# Patient Record
Sex: Male | Born: 1965 | Race: White | Hispanic: No | Smoking: Current every day smoker
Health system: Southern US, Community
[De-identification: ages and names within clinical notes are randomized; demographics above are authoritative.]

## PROBLEM LIST (undated history)

## (undated) DIAGNOSIS — F1910 Other psychoactive substance abuse, uncomplicated: Secondary | ICD-10-CM

---

## 2007-02-14 ENCOUNTER — Emergency Department (HOSPITAL_COMMUNITY): Admission: EM | Admit: 2007-02-14 | Discharge: 2007-02-14 | Payer: Self-pay | Admitting: Emergency Medicine

## 2007-02-14 ENCOUNTER — Inpatient Hospital Stay (HOSPITAL_COMMUNITY): Admission: AD | Admit: 2007-02-14 | Discharge: 2007-02-21 | Payer: Self-pay | Admitting: Psychiatry

## 2007-02-14 ENCOUNTER — Ambulatory Visit: Payer: Self-pay | Admitting: Psychiatry

## 2007-07-05 ENCOUNTER — Emergency Department (HOSPITAL_COMMUNITY): Admission: EM | Admit: 2007-07-05 | Discharge: 2007-07-05 | Payer: Self-pay | Admitting: Emergency Medicine

## 2007-09-27 ENCOUNTER — Ambulatory Visit: Payer: Self-pay | Admitting: Psychiatry

## 2007-09-27 ENCOUNTER — Emergency Department (HOSPITAL_COMMUNITY): Admission: EM | Admit: 2007-09-27 | Discharge: 2007-09-27 | Payer: Self-pay | Admitting: Emergency Medicine

## 2007-09-27 ENCOUNTER — Inpatient Hospital Stay (HOSPITAL_COMMUNITY): Admission: RE | Admit: 2007-09-27 | Discharge: 2007-10-02 | Payer: Self-pay | Admitting: Psychiatry

## 2009-04-14 IMAGING — CR DG LUMBAR SPINE COMPLETE 4+V
5 series · 5 of 5 positions shown · non-contrast
Comparison: None.

CLINICAL DATA: Fell 2 days ago, persistent low back pain.

LUMBAR SPINE - 5 VIEW 07/05/2007:

[t l-spine a.p.]
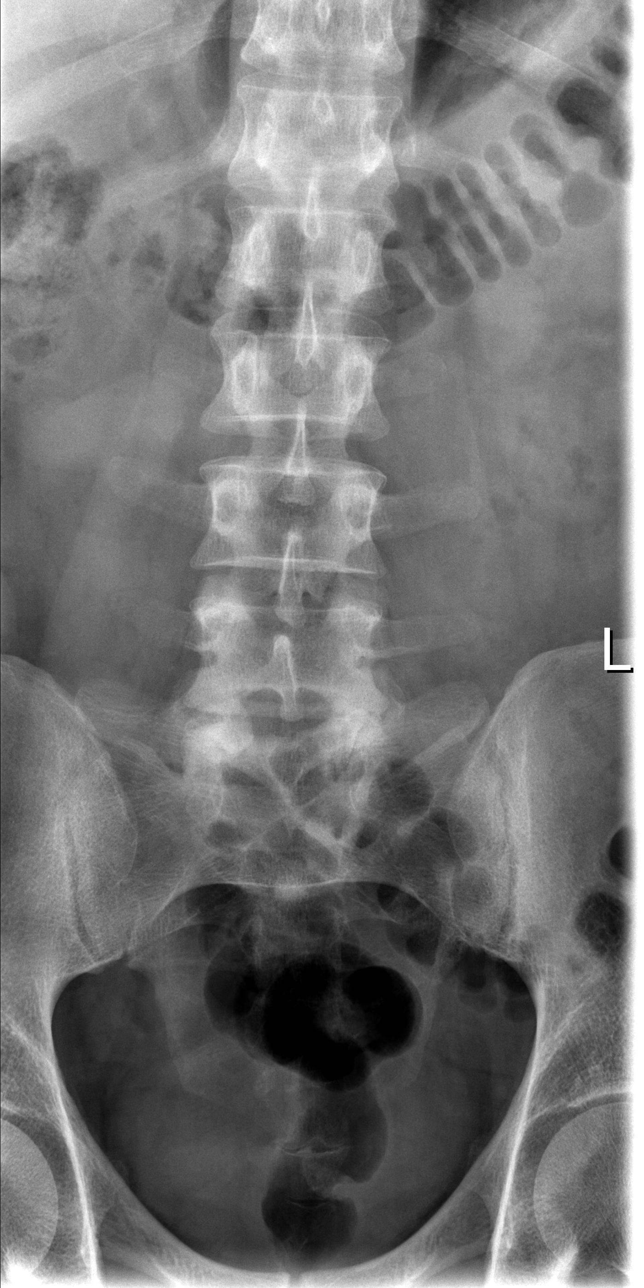

[t l-spine oblique exposure (1 of 2)]
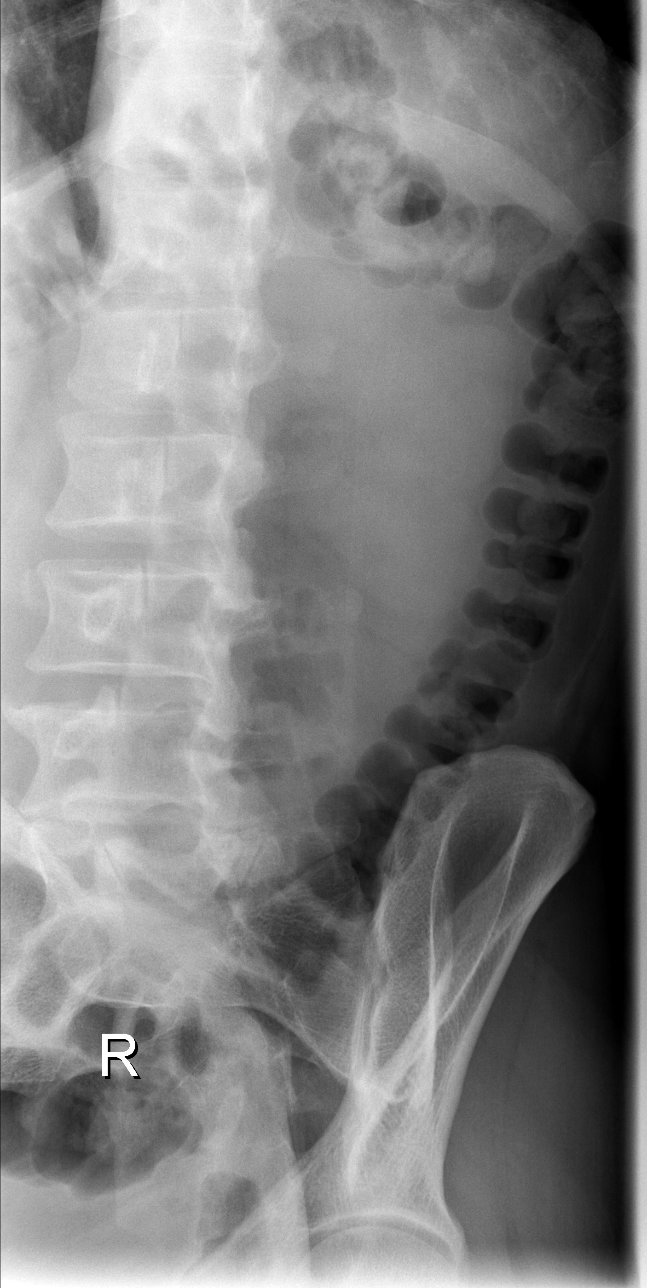

[t l-spine oblique exposure (2 of 2)]
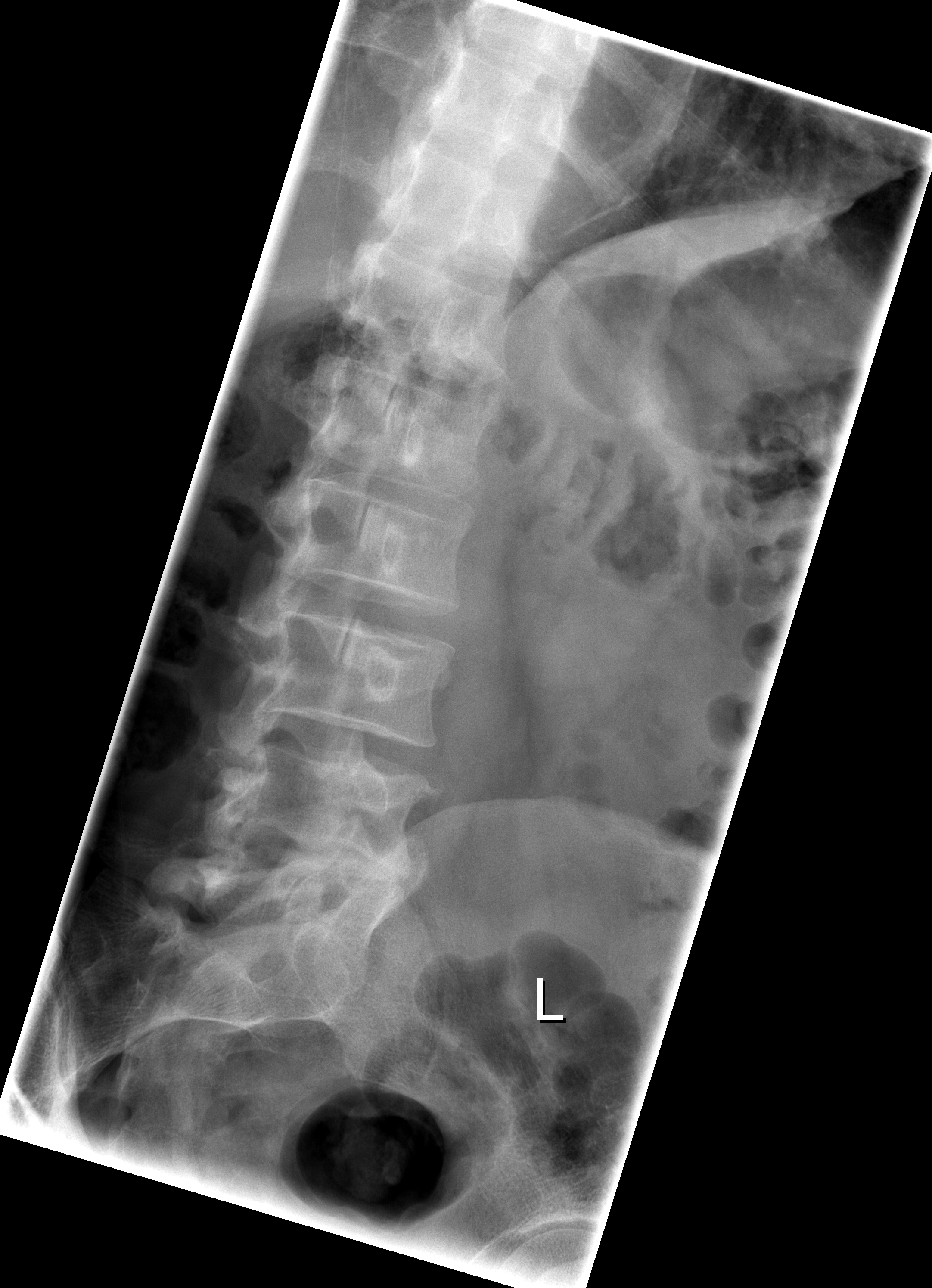

[t l-spine lat]
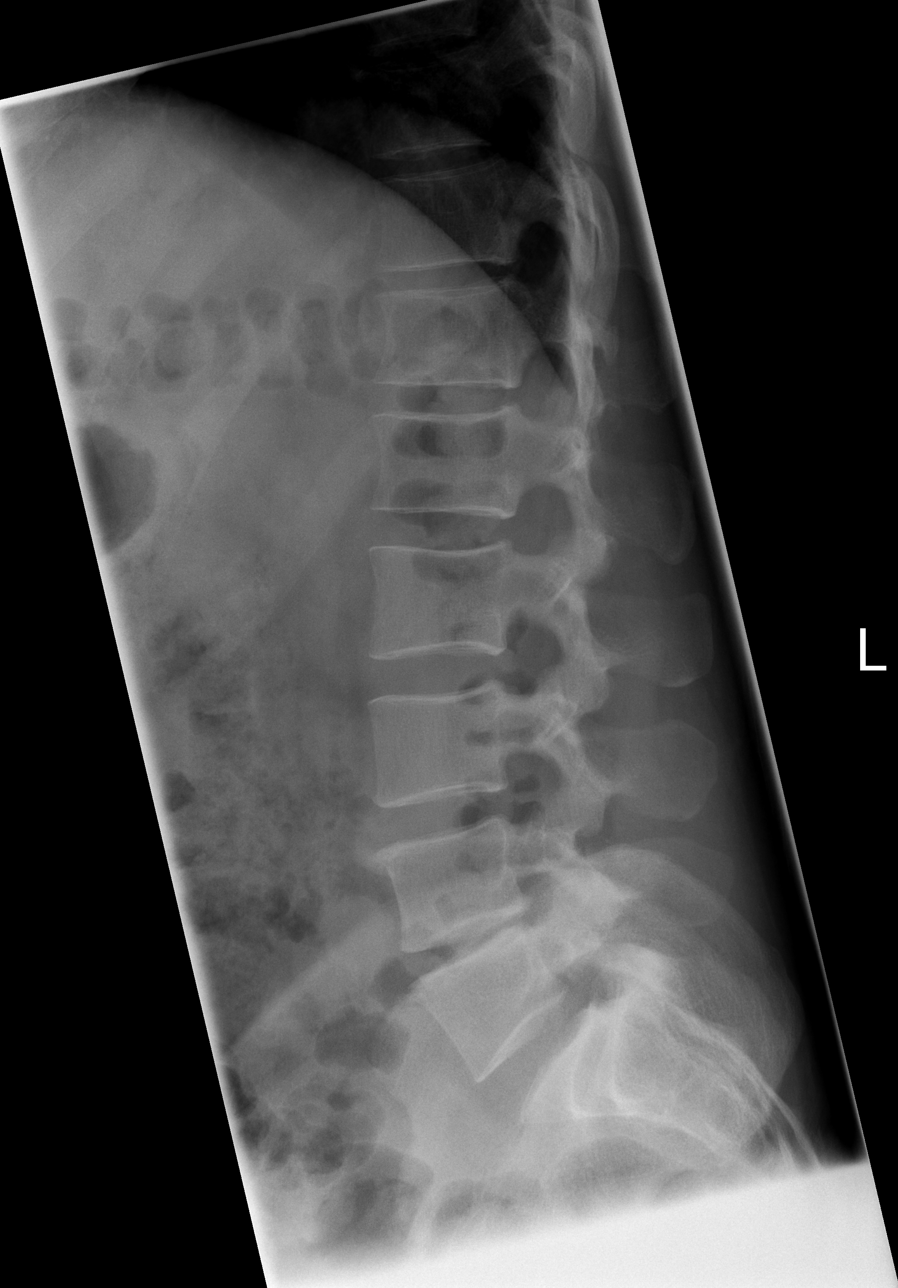

[t l-spine l5-s1 spot]
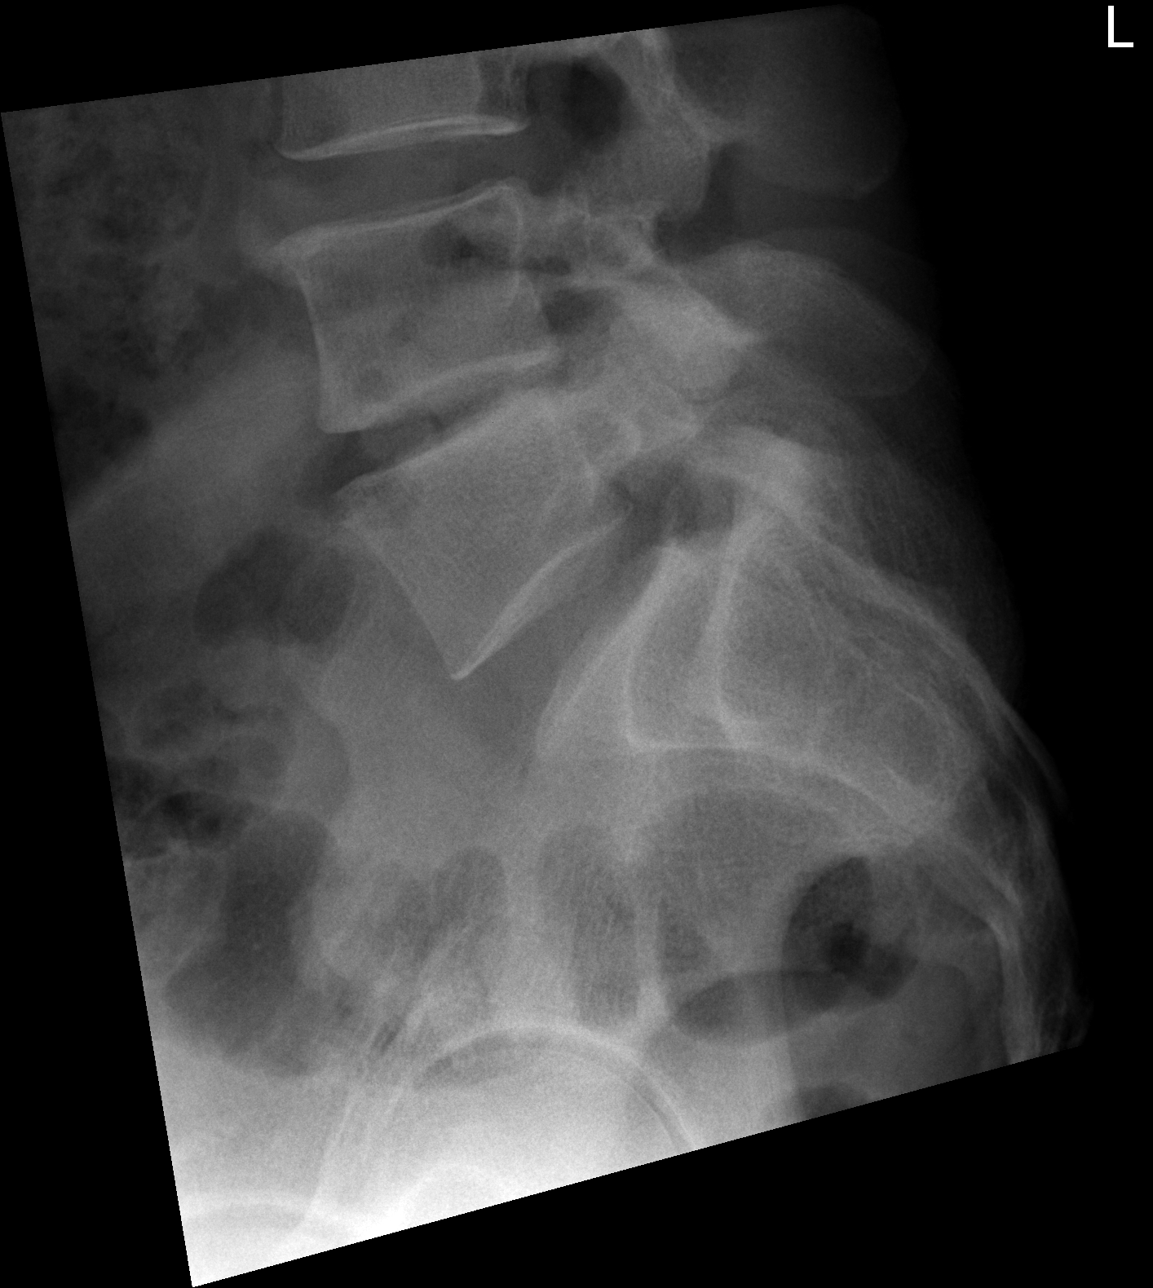

[5 of 5 positions shown; findings below may reference images not displayed]

FINDINGS: Five nonrib-bearing lumbar vertebrae with anatomic alignment. No
visible fractures. Disc space narrowing and associated endplate hypertrophic
changes at L4-L5. Minimal disc space narrowing at L3-L4. No pars defects. No
significant facet arthropathy. Visualized sacroiliac joints intact. Bone island
noted in the midline of the second sacral segment.
IMPRESSION: No acute skeletal abnormalities. Degenerative disc disease and spondylosis at
L4-L5, and minimal disc space narrowing at L3-L4.

## 2010-11-29 NOTE — H&P (Signed)
NAMEVENSON, Timothy Owens             ACCOUNT NO.:  [id number]   MEDICAL RECORD NO.:  [id number]          PATIENT TYPE:  IPS   LOCATION:  0503                          FACILITY:  [facility]   PHYSICIAN:  Geoffery Lyons, M.D.      DATE OF BIRTH:  January 29, 1966   DATE OF ADMISSION:  09/27/2007  DATE OF DISCHARGE:                       PSYCHIATRIC ADMISSION ASSESSMENT   This is a voluntary admission to the services of  Dr. Geoffery Lyons.   IDENTIFYING INFORMATION:  This is a 45 year old, separated, white male.  He reported to the emergency department at [facility].  He reported that  he had tried to jump off a bridge earlier in the day, that he was  suicidal, that he has to get off of crack cocaine, he has been using it  for 23 years, he acknowledged his last use of alcohol and crack at 8:30  in the morning, and he had his luggage with him.  He is hoping to be  reassigned to the [facility].  He states that when he  was there when he was admitted last time, he stayed clean for about four  months.  He is hoping to enter a year-long program, get clean, and stay  clean.   SOCIAL HISTORY:  He has a GED, he has been married once, he is  separated, he has a 11 year old daughter, he is employed as a Ecologist, he got in last Tuesday, and he is not sure that he is still  employed.   PAST PSYCHIATRIC HISTORY:  He was last with [place] July 31st to August 7th.  [facility] he was [facility] for four [facility], and he  stayed clean.   FAMILY HISTORY:  He states years ago he thinks one of his sisters had a  problem with alcohol and pills.   ALCOHOL AND DRUG HISTORY:  He himself reports that he has used crack  cocaine for about 23 years, he uses a half an ounce daily, and he has  been using alcohol since his teens, a pint to a fifth every three to  four weeks for years.  He had no alcohol in his system at the emergency  department, but his UDS was positive for cocaine.   PRIMARY  CARE Deen Deguia:  He has no PCC, he has no psychiatrist.   MEDICAL PROBLEMS:  He has no diagnoses, he is not prescribed any  medication, he reports a DRUG ALLERGY TO PENICILLIN.   POSITIVE PHYSICAL FINDINGS:  He was medically cleared in the ED at [facility].  His potassium was slightly low at 3.3, his UDS was positive for  cocaine, he had no alcohol on board.  Vital signs on admission to our  unit show that he is 69.5 inches tall, and he weighs 177.  Temperature  is 97.8, blood pressure is 96/64 to 107/65, pulse is 85 to 86, and  respirations are 18.   MENTAL STATUS EXAM:  He was examined in his room in the bed.  He is  casually dressed.  He is appropriately groomed and nourished.  His  speech  had a normal rate, rhythm, and tone.  His mood is appropriate to  the situation.  His affect had a normal range.  Thought processes are  clear, rational, and goal oriented.  He is hoping to get into long-term  treatment [facility] [place].  Judgment and insight  are intact.  Concentration and memory are intact.  Intelligence is at  least average.  He denies being actively suicidal or homicidal.  He  denies any auditory or visual hallucinations.   DIAGNOSES:   AXIS I:  1. Polysubstance dependence.  2. Depressive disorder not otherwise specified.   AXIS II:  Deferred.   AXIS III:  None.   AXIS IV:  Primary support, and he may be unemployed at this time.   AXIS V:  Thirty-five.   The plan is to admit for safety and stabilization.  He will be started  on antidepressant.  Toward that end, we will start Celexa 20 mg p.o. at  h.s.  He has already made contact with the [facility]  and he was told that if he was medically cleared here it would move him  up on the list, and he is hoping our case manager will speak with that  case manager on Monday and get that discharge plan set.   ESTIMATED LENGTH OF STAY:  Three to four days.      [name],  P.A.-C.      Geoffery Lyons, M.D.  Electronically Signed    MD/MEDQ  D:  09/28/2007  T:  09/28/2007  Job:  [id number]

## 2010-11-29 NOTE — Discharge Summary (Signed)
NAMEAADITYA, LETIZIA             ACCOUNT NO.:  [id number]   MEDICAL RECORD NO.:  [id number]          PATIENT TYPE:  IPS   LOCATION:  0603                          FACILITY:  [facility]   PHYSICIAN:  Jasmine Pang, M.D. DATE OF BIRTH:  04-15-1966   DATE OF ADMISSION:  02/14/2007  DATE OF DISCHARGE:  02/21/2007                               DISCHARGE SUMMARY   IDENTIFICATION:  This is a 45 year old white male who was admitted on a  voluntary basis on February 14, 2007.   HISTORY OF PRESENT ILLNESS:  The patient stated he had no suicidal with  plan and intent.  He has a history of substance abuse and cannot  contract for safety.  A stressor revolves around a relationship he has  with his girlfriend.  He states he knows he has got to give this  relationship up if he is going to recover because she is also using.  He  admits to using alcohol, a fifth of liquor two times weekly and uses an  eight-ball of cocaine daily.  He has been at [facility] in the  past (February of 2008 and June of 2008) for suicidal ideation and  detox.  He stated he had thoughts of jumping off a bridge.  He denied  hallucinations.  This is his first [facility] admission and he has a positive  history of cutting himself.   MEDICAL HISTORY:  He has no acute or chronic medical problems.   MEDICATIONS:  He is on no medications.   ALLERGIES:  PENICILLIN.   PHYSICAL EXAMINATION:  Physical exam was done in the [facility] ED.  There were no acute physical or medical abnormalities noted.   LABORATORY DATA:  EKG was normal.  Urinalysis was negative.  UDS was  positive for cocaine.  Alcohol was less than 5.  BMET was within normal  limits.  Creatinine was 1.3 and the rest of the labs were done in the ED  and evaluated by the ED physician.   HOSPITAL COURSE:  Upon admission, the patient was started on the Librium  detox protocol and Ambien 10 mg p.o. q.h.s. p.r.n. insomnia and  Symmetrel 100 mg p.o. b.i.d.  On February 17, 2007, he was started on  Seroquel 50 mg p.o. q.4h. p.r.n. anxiety and agitation.  The patient  tolerated his medications well with no significant side effects.  He had  no withdrawal symptoms other than craving of cocaine.  He was able to  participate appropriately in unit therapeutic groups and activities.  He  was friendly and cooperative with good eye contact.  He stated his main  drug of choice was cocaine mixed with alcohol.  He admits to suicidal  ideation because of desperation about his drug use.  He states he has to  let go of his girlfriend because she uses and this is very tough for him  because he loves her.  He talked about dreaming about using crack  cocaine which made him very anxious.  He continued to crave but did feel  the amantadine helped lessen this.  His mood was initially  dysphoric and  anxious.  He was not sure where he was going to go when he leaves.  On  February 19, 2007, his mood improved.  He had gotten into [facility] for rehab.  He felt good about that.  He stated he had  gotten some bad news.  He said someone he knew had been found dead  while they were using crack cocaine.  He was sad about this but overall  felt more optimistic due to his plans for rehab.  On February 21, 2007,  mental status had improved markedly from admission status.  The patient  was friendly and cooperative with good eye contact.  Speech was normal  rate and flow.  Psychomotor activity was within normal limits.  The mood  was euthymic.  Affect wide range.  There was no suicidal or homicidal  ideation.  No thoughts of self-injurious behavior.  No auditory or  visual hallucinations.  No paranoia or delusions.  Thoughts were logical  and goal-directed.  Thought content no predominant theme and the  cognitive was grossly within normal limits.  It was felt the patient was  safe to be discharged today and is looking forward to being picked up by  his sponsor and going to a  meeting and then going to [facility].   DISCHARGE DIAGNOSES:  AXIS I:  Polysubstance dependence.  Substance-  induced mood disorder.  AXIS II:  None.  AXIS III:  No acute or chronic problems.  AXIS IV:  Severe (problems with primary support group, having to break  up with girlfriend, occupational problem, housing problem, burden of  psychiatric and chemical dependence illness).  AXIS V:  GAF on discharge 50; GAF upon admission 40; GAF highest past  year 60-65.   ACTIVITY/DIET:  There were no specific activity level or dietary  restrictions.   DISCHARGE MEDICATIONS:  The patient was discharged on amantadine 100 mg  p.o. b.i.d.  He was on no other medications.   POST-HOSPITAL CARE PLANS:  The patient will be seen by a psychiatrist  and therapist in [place].  This is being arranged by our  casemanager.      Jasmine Pang, M.D.  Electronically Signed     BHS/MEDQ  D:  02/21/2007  T:  02/21/2007  Job:  [id number]

## 2010-12-02 NOTE — Discharge Summary (Signed)
Timothy Owens, Timothy Owens             ACCOUNT NO.:  [id number]   MEDICAL RECORD NO.:  [id number]          PATIENT TYPE:  IPS   LOCATION:  0503                          FACILITY:  [facility]   PHYSICIAN:  Geoffery Lyons, M.D.      DATE OF BIRTH:  27-Dec-1965   DATE OF ADMISSION:  09/27/2007  DATE OF DISCHARGE:  10/02/2007                               DISCHARGE SUMMARY   CHIEF COMPLAINT AND HISTORY OF PRESENT ILLNESS:  This was the first  admission to [facility] for this 45 year old separated  white male.  He had tried to jump off a bridge earlier .  He had to get  off crack cocaine, had been using it for 23 years.  Last use of alcohol  at 8:30 in the morning. Hoping to be going to the [employer]. When he was there, he claimed he stayed clean for about 4  months.  Hoping to enter a year-long program.   PAST PSYCHIATRIC HISTORY:  He was last admitted July 13 to August 7 at  [facility].   ALCOHOL AND DRUG HISTORY:  Crack cocaine for 23 years, alcohol since his  teens, a pint to a fifth every 3-4 week.   PAST MEDICAL HISTORY:  Noncontributory.   MEDICATIONS:  None.   PHYSICAL EXAMINATION:  Exam failed to show any acute findings.   LABORATORY WORK:  Potassium 3.3.  Urine drug screen positive for  cocaine.   MENTAL STATUS EXAM:  Exam reveals alert, cooperative male, appropriately  groomed and nourished.  Speech was normal in rate, rhythm and tone.  Mood was anxious, depressed.  Affect was anxious.  Thought processes  were clear, rational and goal oriented.  Wanting to get into a [facility] in [place].  No active delusions.  No active suicidal or  homicidal ideas. No hallucinations.  Cognition well preserved.   ADMISSION DIAGNOSES:  AXIS I:  1. Cocaine dependence.  2. Depressive disorder not otherwise specified.  AXIS II:  No diagnosis.  AXIS III:  No diagnosis.  AXIS IV:  Moderate.  AXIS V:  On admission 35, highest GAF in the last  year 60.   COURSE IN THE HOSPITAL:  Was admitted, started individual and group  psychotherapy.  He was given trazodone for sleep.  He was detoxified  with Librium.  He was given some Seroquel. 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  Drank liquor, started using cocaine. Developed  chest pain.  He was staying with his boss. After he left the [facility], he said that he came around the wrong group of people. The day  before he was admitted endorsed suicidal ideations, wanting to jump from  a bridge. March 15, he endorsed that while he was undergoing all these  things that he might have acted out sexually. Wanted to be checked for  STDs, HIV. He said that the Seroquel helped him. Did endorse anxiety,  decreased sleep, so we optimized treatment with the Seroquel. Continued  to endorse that he wanted to do the right thing, wanted to recover  his  abstinence. March 17, he was fully detoxified. There were no active  suicidal or homicidal ideas, no hallucinations or delusions.  He was  going to follow up at [facility] and was looking  forward to it.   DISCHARGE DIAGNOSES:  AXIS I:  1. Cocaine, alcohol dependence.  2. Depressive disorder not otherwise specified.  3. Anxiety disorder not otherwise specified.  AXIS II: No diagnosis.  AXIS III:  No diagnosis.  AXIS IV:  Moderate.  AXIS V:  Upon discharge 55-60.   DISCHARGE MEDICATIONS:  1. Celexa 20 mg per day.  2. Seroquel 50 one twice a day and 100 at bedtime.  3. Trazodone 100 at bedtime as needed for sleep.   Follow up at [facility].      Geoffery Lyons, M.D.  Electronically Signed     IL/MEDQ  D:  10/29/2007  T:  10/29/2007  Job:  [id number]

## 2011-01-02 ENCOUNTER — Emergency Department (HOSPITAL_COMMUNITY)
Admission: EM | Admit: 2011-01-02 | Discharge: 2011-01-03 | Disposition: A | Discharge status: Home / Self Care | Payer: Self-pay | Attending: Emergency Medicine | Admitting: Emergency Medicine

## 2011-01-02 DIAGNOSIS — F329 Major depressive disorder, single episode, unspecified: Secondary | ICD-10-CM | POA: Insufficient documentation

## 2011-01-02 DIAGNOSIS — R45851 Suicidal ideations: Secondary | ICD-10-CM | POA: Insufficient documentation

## 2011-01-02 DIAGNOSIS — F1910 Other psychoactive substance abuse, uncomplicated: Secondary | ICD-10-CM | POA: Insufficient documentation

## 2011-01-02 DIAGNOSIS — F3289 Other specified depressive episodes: Secondary | ICD-10-CM | POA: Insufficient documentation

## 2011-01-02 LAB — COMPREHENSIVE METABOLIC PANEL
AST: 19 U/L (ref 0–37)
Alkaline Phosphatase: 65 U/L (ref 39–117)
CO2: 24 mEq/L (ref 19–32)
Chloride: 104 mEq/L (ref 96–112)
Creatinine, Ser: 1.09 mg/dL (ref 0.50–1.35)
GFR calc Af Amer: >60 mL/min (ref >60)
GFR calc non Af Amer: >60 mL/min (ref >60)
Potassium: 4 mEq/L (ref 3.5–5.1)
Total Protein: 7.6 g/dL (ref 6.0–8.3)

## 2011-01-02 LAB — DIFFERENTIAL
Lymphocytes Relative: 31 % (ref 12–46)
Lymphs Abs: 2 10*3/uL (ref 0.7–4.0)
Monocytes Absolute: 0.4 10*3/uL (ref 0.1–1.0)
Monocytes Relative: 6 % (ref 3–12)
Neutro Abs: 3.6 10*3/uL (ref 1.7–7.7)
Neutrophils Relative %: 57 % (ref 43–77)

## 2011-01-02 LAB — RAPID URINE DRUG SCREEN, HOSP PERFORMED
Barbiturates: NOT DETECTED
Benzodiazepines: POSITIVE — AB
Cocaine: POSITIVE — AB
Opiates: NOT DETECTED
Tetrahydrocannabinol: POSITIVE — AB

## 2011-01-02 LAB — ETHANOL: Alcohol, Ethyl (B): <11 mg/dL (ref 0–11)

## 2011-01-02 LAB — CBC
Hemoglobin: 16.6 g/dL (ref 13.0–17.0)
MCHC: 36.2 g/dL — ABNORMAL HIGH (ref 30.0–36.0)
MCV: 83.5 fL (ref 78.0–100.0)
RDW: 12.6 % (ref 11.5–15.5)

## 2011-04-10 LAB — ETHANOL: Alcohol, Ethyl (B): <5

## 2011-04-10 LAB — CBC
HCT: 43.1
Hemoglobin: 14.8
MCHC: 34.4
MCV: 86.6
RBC: 4.98

## 2011-04-10 LAB — COMPREHENSIVE METABOLIC PANEL
Albumin: 4.6
Calcium: 9.8
Glucose, Bld: 92
Potassium: 3.3 — ABNORMAL LOW
Total Bilirubin: 1

## 2011-04-10 LAB — DIFFERENTIAL
Basophils Absolute: 0
Basophils Relative: 0
Monocytes Absolute: 0.6
Neutro Abs: 6

## 2011-04-10 LAB — RAPID URINE DRUG SCREEN, HOSP PERFORMED
Benzodiazepines: NOT DETECTED
Cocaine: POSITIVE — AB
Opiates: NOT DETECTED

## 2011-04-10 LAB — POCT CARDIAC MARKERS
CKMB, poc: 2.2
Operator id: 114141
Operator id: 288331
Troponin i, poc: <0.05
Troponin i, poc: <0.05

## 2011-04-10 LAB — URINALYSIS, ROUTINE W REFLEX MICROSCOPIC
Bilirubin Urine: NEGATIVE
Hgb urine dipstick: NEGATIVE
Ketones, ur: NEGATIVE
Nitrite: NEGATIVE
pH: 6

## 2011-04-10 LAB — GC/CHLAMYDIA PROBE AMP, URINE: Chlamydia, Swab/Urine, PCR: NEGATIVE

## 2011-04-10 LAB — HEPATITIS PANEL, ACUTE: Hep A IgM: NEGATIVE

## 2011-04-21 LAB — URINALYSIS, ROUTINE W REFLEX MICROSCOPIC
Nitrite: NEGATIVE
Specific Gravity, Urine: 1.022
pH: 7.5

## 2011-04-21 LAB — CBC
HCT: 45.1
Hemoglobin: 15.2
MCV: 86.9
WBC: 9.3

## 2011-04-21 LAB — HEPATIC FUNCTION PANEL
ALT: 19
AST: 23
Albumin: 4.6
Indirect Bilirubin: 0.6
Total Protein: 7.7

## 2011-04-21 LAB — DIFFERENTIAL
Basophils Relative: 1
Eosinophils Absolute: 0.2

## 2011-04-21 LAB — I-STAT 8, (EC8 V) (CONVERTED LAB)
BUN: 15
Chloride: 103
Glucose, Bld: 92
Potassium: 3.6
TCO2: 28
pH, Ven: 7.379 — ABNORMAL HIGH

## 2011-04-21 LAB — POCT I-STAT CREATININE
Creatinine, Ser: 1.2
Operator id: 161631

## 2011-04-21 LAB — RAPID URINE DRUG SCREEN, HOSP PERFORMED
Barbiturates: NOT DETECTED
Cocaine: POSITIVE — AB
Opiates: POSITIVE — AB

## 2011-05-01 LAB — URINALYSIS, ROUTINE W REFLEX MICROSCOPIC
Bilirubin Urine: NEGATIVE
Glucose, UA: NEGATIVE
Ketones, ur: NEGATIVE
Specific Gravity, Urine: 1.03
pH: 6

## 2011-05-01 LAB — RAPID URINE DRUG SCREEN, HOSP PERFORMED
Amphetamines: NOT DETECTED
Barbiturates: NOT DETECTED
Benzodiazepines: NOT DETECTED
Cocaine: POSITIVE — AB

## 2011-05-01 LAB — I-STAT 8, (EC8 V) (CONVERTED LAB)
Acid-Base Excess: 2
Chloride: 103
Glucose, Bld: 97
TCO2: 30
pCO2, Ven: 48.4
pH, Ven: 7.379 — ABNORMAL HIGH

## 2011-05-01 LAB — POCT CARDIAC MARKERS
CKMB, poc: <1 — ABNORMAL LOW
Myoglobin, poc: 77.6
Operator id: 279831

## 2011-05-01 LAB — POCT I-STAT CREATININE
Creatinine, Ser: 1.3
Operator id: 279831

## 2011-06-13 ENCOUNTER — Emergency Department (HOSPITAL_BASED_OUTPATIENT_CLINIC_OR_DEPARTMENT_OTHER)
Admission: EM | Admit: 2011-06-13 | Discharge: 2011-06-13 | Disposition: A | Discharge status: Home / Self Care | Payer: Self-pay | Attending: Emergency Medicine | Admitting: Emergency Medicine

## 2011-06-13 ENCOUNTER — Encounter: Payer: Self-pay | Admitting: *Deleted

## 2011-06-13 DIAGNOSIS — H6120 Impacted cerumen, unspecified ear: Secondary | ICD-10-CM | POA: Insufficient documentation

## 2011-06-13 DIAGNOSIS — J329 Chronic sinusitis, unspecified: Secondary | ICD-10-CM | POA: Insufficient documentation

## 2011-06-13 DIAGNOSIS — F17200 Nicotine dependence, unspecified, uncomplicated: Secondary | ICD-10-CM | POA: Insufficient documentation

## 2011-06-13 HISTORY — DX: Other psychoactive substance abuse, uncomplicated: F19.10

## 2011-06-13 MED ORDER — AZITHROMYCIN 250 MG PO TABS: 250.0000 mg | ORAL_TABLET | Freq: Every day | ORAL | Status: AC

## 2011-06-13 MED ORDER — AZITHROMYCIN 250 MG PO TABS
500.0000 mg | ORAL_TABLET | Freq: Once | ORAL | Status: AC
  Administered 2011-06-13: 500 mg via ORAL
  Filled 2011-06-13: qty 2

## 2011-06-13 NOTE — ED Provider Notes (Signed)
History     CSN: [id number] Arrival date & time: 06/13/2011  9:32 AM   First MD Initiated Contact with Patient 06/13/11 [phone]      Chief Complaint  Patient presents with  . Nasal Congestion  . Sinusitis    (Consider location/radiation/quality/duration/timing/severity/associated sxs/prior treatment) Patient is a 45 y.o. male presenting with sinusitis. The history is provided by the patient.  Sinusitis  This is a new problem. Episode onset: 2 weeks ago. The problem has not changed since onset.There has been no fever. The pain is at a severity of 2/10. The pain is mild. The pain has been constant since onset. Associated symptoms include congestion, ear pain, sinus pressure and cough. Pertinent negatives include no sweats, no sore throat and no shortness of breath. Associated symptoms comments: Difficulty hearing out of the left ear. He has tried nothing for the symptoms. The treatment provided no relief.    Past Medical History  Diagnosis Date  . Substance abuse     History reviewed. No pertinent past surgical history.  History reviewed. No pertinent family history.  History  Substance Use Topics  . Smoking status: Current Everyday Smoker  . Smokeless tobacco: Not on file  . Alcohol Use: No      Review of Systems  HENT: Positive for ear pain, congestion and sinus pressure. Negative for sore throat.   Eyes: Negative for pain and redness.  Respiratory: Positive for cough. Negative for shortness of breath.   All other systems reviewed and are negative.    Allergies  Penicillins  Home Medications   Current Outpatient Rx  Name Route Sig Dispense Refill  . AZITHROMYCIN 250 MG PO TABS Oral Take 1 tablet (250 mg total) by mouth daily. 4 tablet 0    BP 114/76  Pulse 89  Temp(Src) 97.9 F (36.6 C) (Oral)  Resp 18  SpO2 100%  Physical Exam  Nursing note and vitals reviewed. Constitutional: He is oriented to person, place, and time. He appears well-developed and  well-nourished. No distress.  HENT:  Head: Normocephalic and atraumatic.  Nose: Mucosal edema and rhinorrhea present. Right sinus exhibits maxillary sinus tenderness and frontal sinus tenderness. Left sinus exhibits maxillary sinus tenderness and frontal sinus tenderness.  Mouth/Throat: Oropharynx is clear and moist.       Cerumen impaction in bilateral ears. A pain over bilateral frontal and maxillary sinuses with palpation   Eyes: Conjunctivae and EOM are normal. Pupils are equal, round, and reactive to light.  Neck: Normal range of motion. Neck supple.  Cardiovascular: Normal rate, regular rhythm and intact distal pulses.   No murmur heard. Pulmonary/Chest: Effort normal and breath sounds normal. No respiratory distress. He has no wheezes. He has no rales.  Abdominal: Soft. He exhibits no distension. There is no tenderness. There is no rebound and no guarding.  Musculoskeletal: Normal range of motion. He exhibits no edema and no tenderness.  Neurological: He is alert and oriented to person, place, and time.  Skin: Skin is warm and dry. No rash noted. No erythema.  Psychiatric: He has a normal mood and affect. His behavior is normal.    ED Course  EAR CERUMEN REMOVAL Performed by: Gwyneth Sprout Authorized by: Gwyneth Sprout  EAR CERUMEN REMOVAL Date/Time: 06/13/2011 3:54 PM Performed by: Gwyneth Sprout Authorized by: Gwyneth Sprout Consent: Verbal consent not obtained. Local anesthetic: none Location details: left ear Procedure type: irrigation Patient sedated: no Patient tolerance: Patient tolerated the procedure well with no immediate complications. Comments: A large amount of  cerumen removed from the left ear canal with improvement in patient's hearing   (including critical care time)  Labs Reviewed - No data to display No results found.   1. Sinusitis   2. Excess ear wax       MDM   Pt with symptoms consistent with viral URI.  Well appearing here.   No signs of breathing difficulty  No signs of pharyngitis, otitis or abnormal abdominal findings.  However one week of tenderness over the frontal and maxillary sinus with significant tenderness here. Will treat for acute sinusitis with a Z-Pak. Also patient complaining of difficulty hearing out of his left ear. After repeat it flushes from the left ear removed a large amount of wax and patient's hearing improved.      Gwyneth Sprout, MD 06/13/11 1556

## 2011-06-13 NOTE — ED Notes (Signed)
Pt reports cough, congestion, and sinus pressure x 3 days, denies any other c/o.

## 2013-07-29 ENCOUNTER — Encounter (HOSPITAL_BASED_OUTPATIENT_CLINIC_OR_DEPARTMENT_OTHER): Payer: Self-pay | Admitting: Emergency Medicine

## 2013-07-29 ENCOUNTER — Emergency Department (HOSPITAL_BASED_OUTPATIENT_CLINIC_OR_DEPARTMENT_OTHER)
Admission: EM | Admit: 2013-07-29 | Discharge: 2013-07-29 | Disposition: A | Discharge status: Home / Self Care | Payer: Self-pay | Attending: Emergency Medicine | Admitting: Emergency Medicine

## 2013-07-29 DIAGNOSIS — Z880 Allergy status to penicillin: Secondary | ICD-10-CM | POA: Insufficient documentation

## 2013-07-29 DIAGNOSIS — K644 Residual hemorrhoidal skin tags: Secondary | ICD-10-CM | POA: Insufficient documentation

## 2013-07-29 DIAGNOSIS — F17200 Nicotine dependence, unspecified, uncomplicated: Secondary | ICD-10-CM | POA: Insufficient documentation

## 2013-07-29 DIAGNOSIS — K602 Anal fissure, unspecified: Secondary | ICD-10-CM | POA: Insufficient documentation

## 2013-07-29 MED ORDER — HYDROCORTISONE 1 % EX CREA: TOPICAL_CREAM | CUTANEOUS | Status: AC

## 2013-07-29 NOTE — Discharge Instructions (Signed)
Anal Fissure, Adult  An anal fissure is a small tear or crack in the skin around the anus. Bleeding from a fissure usually stops on its own within a few minutes. However, bleeding will often reoccur with each bowel movement until the crack heals.   CAUSES    Passing large, hard stools.   Frequent diarrheal stools.   Constipation.   Inflammatory bowel disease (Crohn's disease or ulcerative colitis).   Infections.   Anal sex.  SYMPTOMS    Small amounts of blood seen on your stools, on toilet paper, or in the toilet after a bowel movement.   Rectal bleeding.   Painful bowel movements.   Itching or irritation around the anus.  DIAGNOSIS  Your caregiver will examine the anal area. An anal fissure can usually be seen with careful inspection. A rectal exam may be performed and a short tube (anoscope) may be used to examine the anal canal.  TREATMENT    You may be instructed to take fiber supplements. These supplements can soften your stool to help make bowel movements easier.   Sitz baths may be recommended to help heal the tear. Do not use soap in the sitz baths.   A medicated cream or ointment may be prescribed to lessen discomfort.  HOME CARE INSTRUCTIONS    Maintain a diet high in fruits, whole grains, and vegetables. Avoid constipating foods like bananas and dairy products.   Take sitz baths as directed by your caregiver.   Drink enough fluids to keep your urine clear or pale yellow.   Only take over-the-counter or prescription medicines for pain, discomfort, or fever as directed by your caregiver. Do not take aspirin as this may increase bleeding.   Do not use ointments containing numbing medications (anesthetics) or hydrocortisone. They could slow healing.  SEEK MEDICAL CARE IF:    Your fissure is not completely healed within 3 days.   You have further bleeding.   You have a fever.   You have diarrhea mixed with blood.   You have pain.   Your problem is getting worse rather than  better.  MAKE SURE YOU:    Understand these instructions.   Will watch your condition.   Will get help right away if you are not doing well or get worse.  Document Released: 07/03/2005 Document Revised: 09/25/2011 Document Reviewed: 12/18/2010  [employer] Patient Information 2014 [employer].

## 2013-07-29 NOTE — ED Provider Notes (Addendum)
CSN: [id number]     Arrival date & time 07/29/13  0010 History  This chart was scribed for Chrysa Rampy Smitty CordsK Stacee Earp-Rasch, MD by Danella Maiersaroline Early, ED Scribe. This patient was seen in room MH06/MH06 and the patient's care was started at 12:35 AM.    Chief Complaint  Patient presents with  . Rectal Pain   Patient is a 48 y.o. male presenting with hematochezia. The history is provided by the patient. No language interpreter was used.  Rectal Bleeding Quality:  Bright red Amount:  Scant Duration: intermittently for a year. Timing:  Rare Progression:  Unchanged Chronicity:  Chronic Context: not rectal injury   Context comment:  Itching and burning or the rectum Similar prior episodes: yes   Relieved by:  Nothing Worsened by:  Nothing tried Ineffective treatments:  None tried Associated symptoms: no abdominal pain   Risk factors: no anticoagulant use, no hx of colorectal cancer and no hx of colorectal surgery    HPI Comments: Ilean ChinaWayne Scaff is a 48 y.o. male who [facility] Emergency Department [facility] complaining of rectal pain with BMs with intermittent bleeding over the last year that worsened 5 days ago. He states sometimes it itches and burns. He denies straining during BMs. He denies h/o hemorrhoid surgeries.    Past Medical History  Diagnosis Date  . Substance abuse    History reviewed. No pertinent past surgical history. History reviewed. No pertinent family history. History  Substance Use Topics  . Smoking status: Current Every Day Smoker  . Smokeless tobacco: Not on file  . Alcohol Use: No    Review of Systems  Gastrointestinal: Positive for hematochezia, anal bleeding and rectal pain. Negative for abdominal pain.  All other systems reviewed and are negative.    Allergies  Penicillins  Home Medications  No current outpatient prescriptions on file. BP 116/70  Pulse 97  Temp(Src) 98.7 F (37.1 C) (Oral)  Resp 16  Ht 5\' 9"  (1.753 m)  Wt  205 lb (92.987 kg)  BMI 30.26 kg/m2  SpO2 99% Physical Exam  Nursing note and vitals reviewed. Constitutional: He is oriented to person, place, and time. He appears well-developed and well-nourished. No distress.  HENT:  Head: Normocephalic and atraumatic.  Mouth/Throat: Oropharynx is clear and moist.  Eyes: EOM are normal. Pupils are equal, round, and reactive to light.  Neck: Normal range of motion. Neck supple. No tracheal deviation present.  Cardiovascular: Normal rate and regular rhythm.   Pulmonary/Chest: Effort normal. No respiratory distress. He has no wheezes.  Abdominal: Soft. Bowel sounds are normal. There is no tenderness. There is no rebound and no guarding.  Genitourinary:  Chaperones present. . Redness present.no fistulas.  No abscesses Anal fissure no internal mass scant normal colored stool in the rectal vault.    Musculoskeletal: Normal range of motion.  Neurological: He is alert and oriented to person, place, and time.  Skin: Skin is warm and dry.  Psychiatric: He has a normal mood and affect. His behavior is normal.    ED Course  Procedures (including critical care time) Medications - No data to display  DIAGNOSTIC STUDIES: Oxygen Saturation is 99% on RA, normal by my interpretation.    COORDINATION OF CARE: 12:41 AM- Discussed treatment plan with pt. Pt agrees to plan.    Labs Review Labs Reviewed - No data to display Imaging Review No results found.  EKG Interpretation   None       MDM  No diagnosis found.  Stool softeners to keep stool the consiitentcy of toothpaste or thinner prep H or tucks cooling pad using baby wipes and blotting instead of wiping. Will give refer to gastrologist in the area.   I personally performed the services described in this documentation, which was scribed in my presence. The recorded information has been reviewed and is accurate.    Jasmine Awe, MD 07/29/13 0449  Blaze Sandin K Kealii Thueson-Rasch, MD 07/29/13  [phone]

## 2013-07-29 NOTE — ED Notes (Addendum)
Pt c/o rectal pain with BM and at times bleeding noted pt is from [facility]
